# Patient Record
Sex: Female | Born: 1965 | Race: White | Hispanic: No | Marital: Married | Smoking: Never smoker
Health system: Southern US, Community
[De-identification: ages and names within clinical notes are randomized; demographics above are authoritative.]

## PROBLEM LIST (undated history)

## (undated) HISTORY — PX: FOOT SURGERY: SHX648

---

## 2007-01-19 ENCOUNTER — Emergency Department: Payer: Self-pay | Admitting: Emergency Medicine

## 2008-11-20 IMAGING — US ABDOMEN ULTRASOUND
1 series · 17 of 25 positions shown · non-contrast
Comparison: none

REASON FOR EXAM: RUQ pain
COMMENTS:

PROCEDURE:     US  - US ABDOMEN GENERAL SURVEY  - January 19, 2007  [DATE]
RESULT:     Comparison: No available comparison exam.

[Series 1: abdomen ultrasound · 17 of 55 slices shown]
[im 1/55]
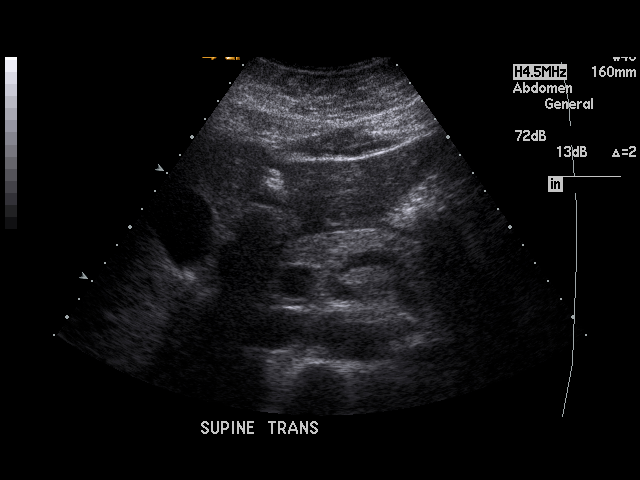
[im 5/55]
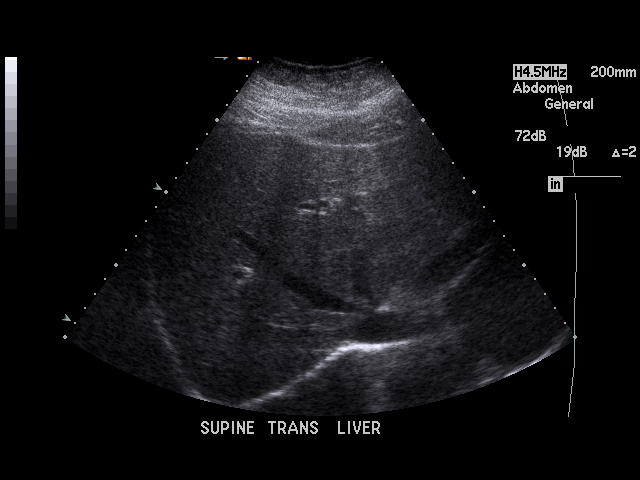
[im 7/55]
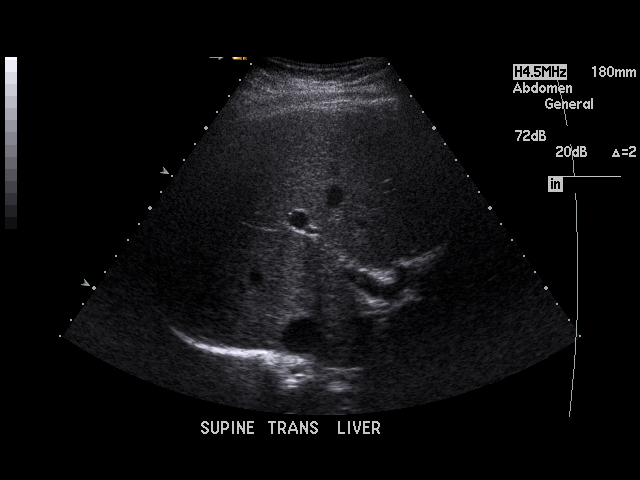
[im 12/55]
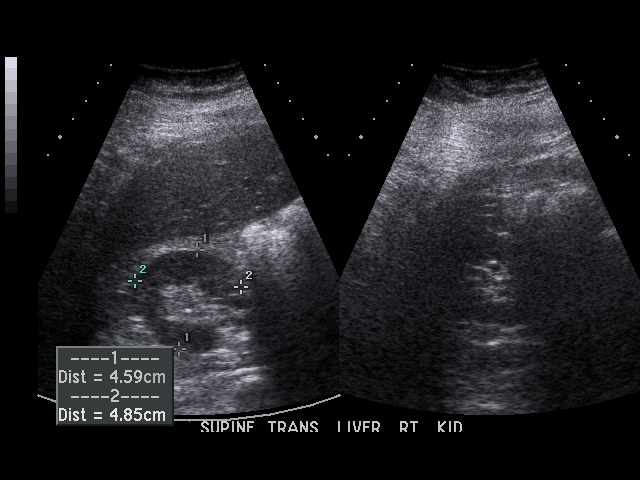
[im 14/55]
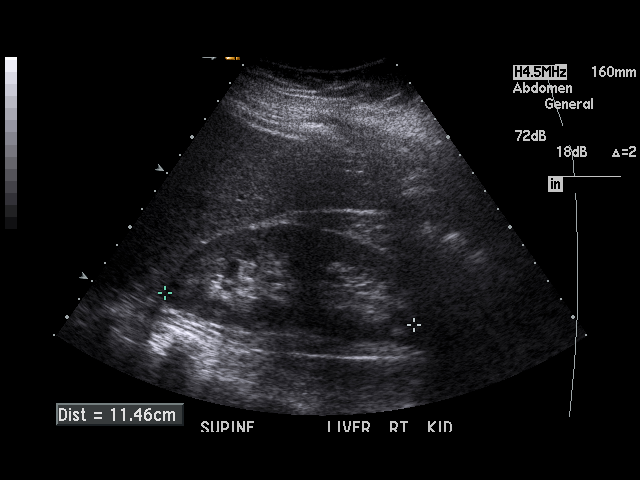
[im 19/55]
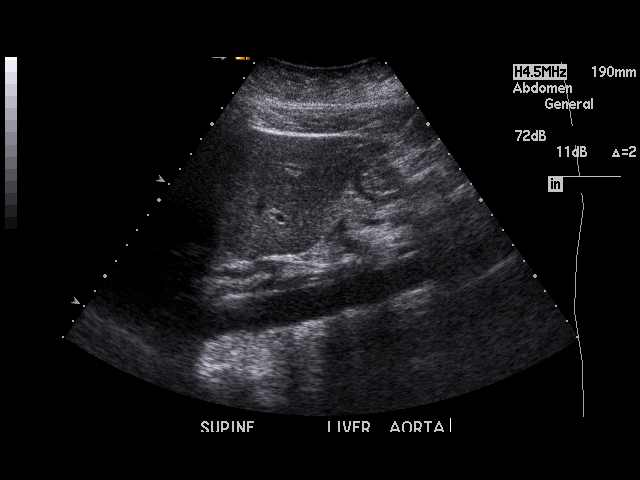
[im 21/55]
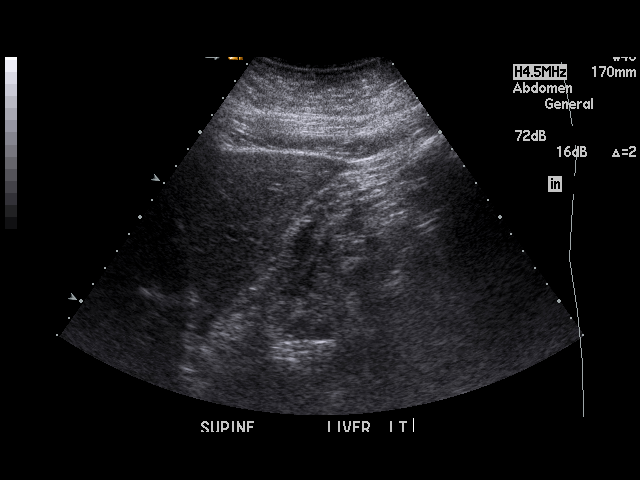
[im 25/55]
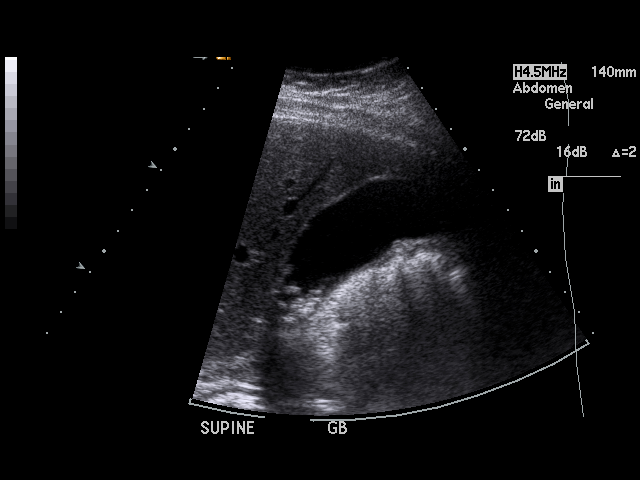
[im 28/55]
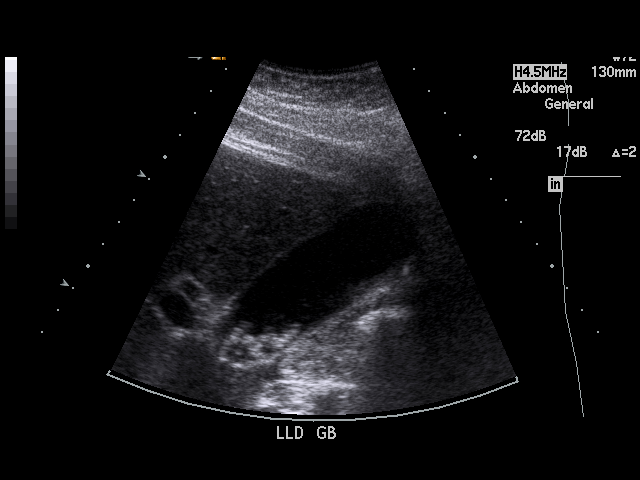
[im 30/55]
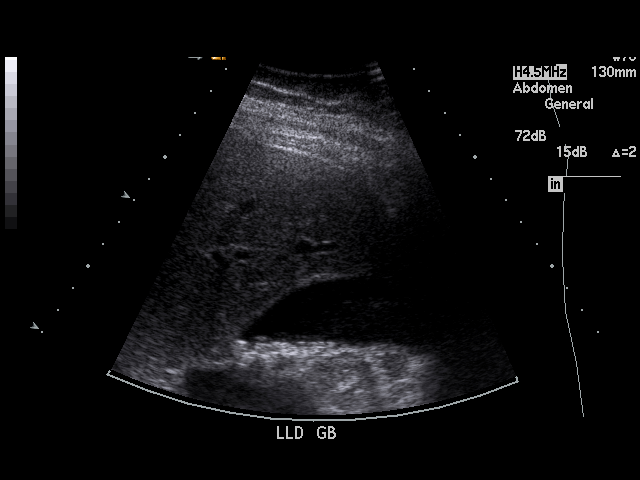
[im 34/55]
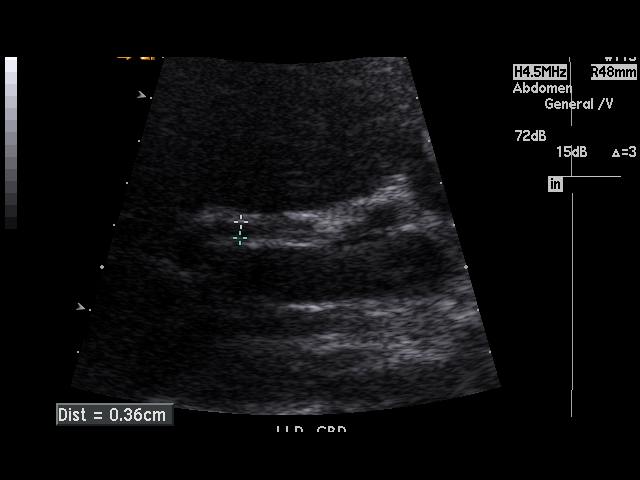
[im 37/55]
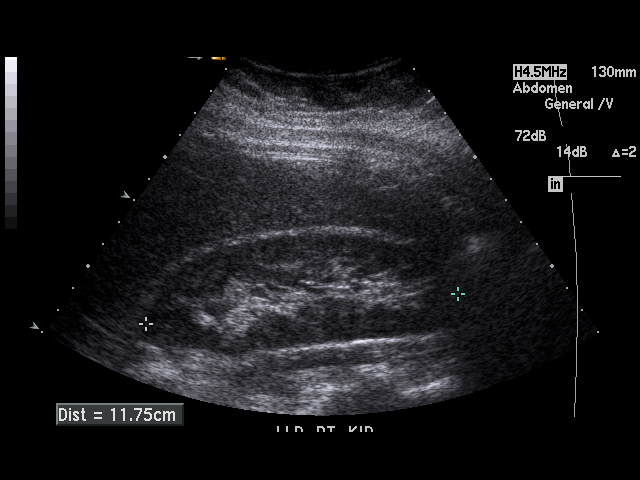
[im 41/55]
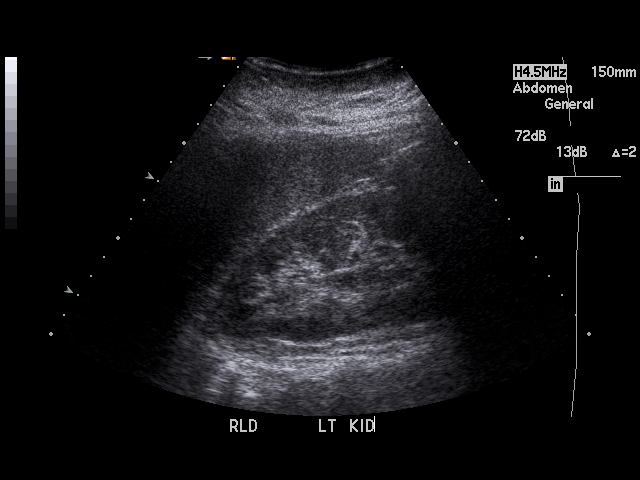
[im 43/55]
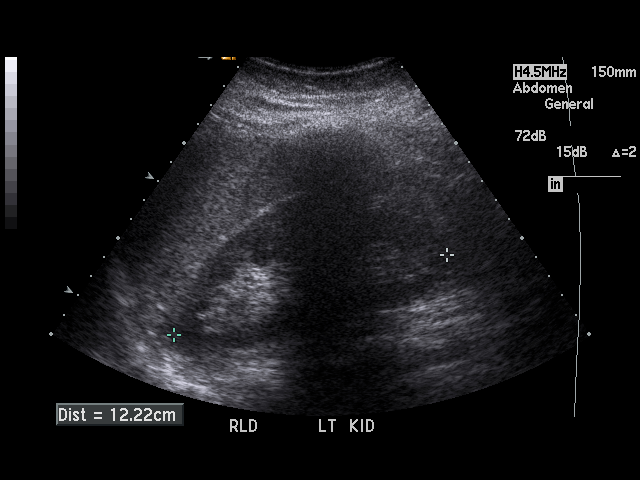
[im 48/55]
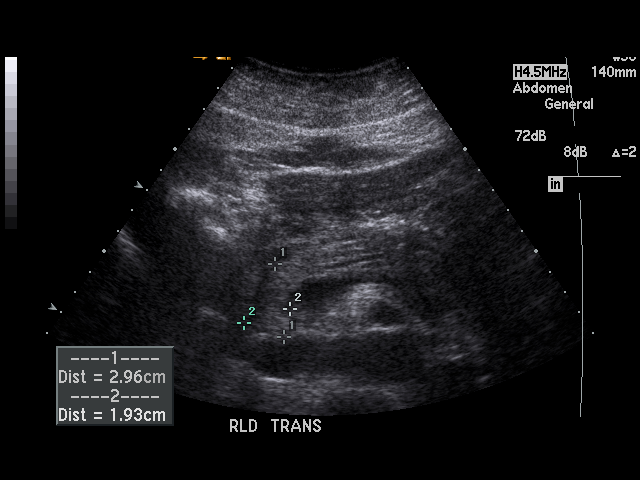
[im 50/55]
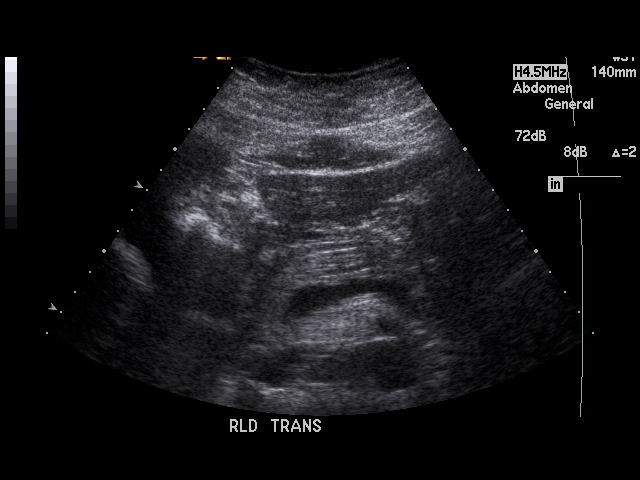
[im 55/55]
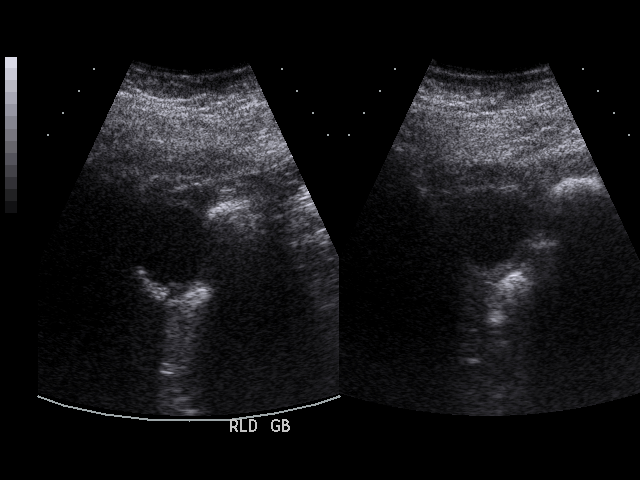

[17 of 25 positions shown; findings below may reference images not displayed]

FINDINGS: The liver is unremarkable without evidence of a focal mass. There is no
intra or extrahepatic bile duct dilatation. The common duct measures 4 mm in
diameter. Multiple small gallstones are seen. There is no significant
gallbladder wall thickening or pericholecystic fluid. There is positive
sonographic Murphy's sign. The main portal vein is patent with hepatopedal
flow.

The pancreas is unremarkable. The right kidney measures 12.2 cm and the left
measures 12.6 cm in length. There is normal corticomedullary
differentiation. No definite renal stone or mass is noted. There is no
hydronephrosis. The spleen measures 11.9 cm in length. No evidence of
abdominal aortic aneurysm.
IMPRESSION: 1. Cholelithiasis with positive sonographic Murphy sign can represent acute
cholecystitis.

Preliminary report was called to Dr. Rousevelt at [DATE] on 01/19/07 by
ultrasound technologist.

## 2013-02-17 ENCOUNTER — Other Ambulatory Visit: Payer: Self-pay | Admitting: *Deleted

## 2013-02-18 ENCOUNTER — Encounter: Payer: Self-pay | Admitting: Podiatry

## 2013-02-18 DIAGNOSIS — M2010 Hallux valgus (acquired), unspecified foot: Secondary | ICD-10-CM

## 2013-02-24 ENCOUNTER — Encounter: Payer: Self-pay | Admitting: *Deleted

## 2013-02-25 ENCOUNTER — Ambulatory Visit (INDEPENDENT_AMBULATORY_CARE_PROVIDER_SITE_OTHER): Payer: 59 | Admitting: Podiatry

## 2013-02-25 ENCOUNTER — Encounter: Payer: Self-pay | Admitting: Podiatry

## 2013-02-25 ENCOUNTER — Ambulatory Visit (INDEPENDENT_AMBULATORY_CARE_PROVIDER_SITE_OTHER): Payer: 59

## 2013-02-25 VITALS — BP 149/99 | HR 109 | Resp 12 | Ht 68.0 in | Wt 250.0 lb

## 2013-02-25 DIAGNOSIS — Z9889 Other specified postprocedural states: Secondary | ICD-10-CM

## 2013-02-25 DIAGNOSIS — R609 Edema, unspecified: Secondary | ICD-10-CM

## 2013-02-25 DIAGNOSIS — M2010 Hallux valgus (acquired), unspecified foot: Secondary | ICD-10-CM

## 2013-02-25 NOTE — Patient Instructions (Signed)
Wear boot or shoe at all times when walking for next 3 weeks

## 2013-02-25 NOTE — Progress Notes (Signed)
Subjective:     Patient ID: Robin Porter, female   DOB: 1965/11/27, 47 y.o.   MRN: [id number]  HPI patient states that she is doing very well with her surgery and having minimal discomfort. Presents wearing her boot 1 week after foot surgery right   Review of Systems  All other systems reviewed and are negative.       Objective:   Physical Exam  Nursing note and vitals reviewed. Cardiovascular: Intact distal pulses.    negative Homans sign was noted and neurological sensation intact. Upon removal of dressing incision site is healing well with good alignment and wound edges well coapted with normal edema noted    Assessment:     Doing well 1 week postop [place] bunionectomy right    Plan:     X-ray reviewed with patient sterile dressing reapplied and Darco shoe dispensed reappoint 2 weeks

## 2013-03-18 ENCOUNTER — Ambulatory Visit (INDEPENDENT_AMBULATORY_CARE_PROVIDER_SITE_OTHER): Payer: 59

## 2013-03-18 ENCOUNTER — Ambulatory Visit (INDEPENDENT_AMBULATORY_CARE_PROVIDER_SITE_OTHER): Payer: 59 | Admitting: Podiatry

## 2013-03-18 ENCOUNTER — Encounter: Payer: Self-pay | Admitting: Podiatry

## 2013-03-18 VITALS — BP 136/90 | HR 91 | Resp 16 | Ht 68.0 in | Wt 250.0 lb

## 2013-03-18 DIAGNOSIS — M2010 Hallux valgus (acquired), unspecified foot: Secondary | ICD-10-CM

## 2013-03-18 DIAGNOSIS — Z9889 Other specified postprocedural states: Secondary | ICD-10-CM

## 2013-03-18 NOTE — Progress Notes (Signed)
Subjective:     Patient ID: Robin Porter, female   DOB: 08/02/1965, 47 y.o.   MRN: [id number]  HPI patient states my foot is doing fine but I over did it one day and my foot was swollen and I got a small opening in my incision site. It's not been draining or red but I wanted you to check 4 weeks after [facility] surgery right foot   Review of Systems     Objective:   Physical Exam  Nursing note and vitals reviewed. Constitutional: She is oriented to person, place, and time.  Cardiovascular: Intact distal pulses.   Musculoskeletal: Normal range of motion.  Neurological: She is oriented to person, place, and time.  Skin: Skin is warm.   patient's right foot the incision site is healing with a small gap in the proximal one third that is localized with no erythema edema or drainage noted. The range of motion of the joint is approximately 20/25 degrees of dorsiflexion 20 of plantar flexion with no pain no crepitus    Assessment:     No indications of infection right incision site and mildly diminished range of motion over were I would like it to be first MPJ    Plan:     Reviewed x-rays and discussed the importance of moving the big toe joint applied a small amount of Iodosorb to the open area and instructed on Band-Aid therapy and Neosporin. If any issues should occur she is to let us know immediately if not will be seen back in 4 weeks right. She admits she has not been walking on the side of foot like she should and she promises to start doing at this time.

## 2013-04-15 ENCOUNTER — Encounter: Payer: 59 | Admitting: Podiatry

## 2013-05-12 NOTE — Progress Notes (Signed)
1. [place] BUNIONECTOMY WITH PIN FIXATION

## 2017-07-07 ENCOUNTER — Encounter: Payer: Self-pay | Admitting: Emergency Medicine

## 2017-07-07 ENCOUNTER — Emergency Department
Admission: EM | Admit: 2017-07-07 | Discharge: 2017-07-07 | Disposition: A | Discharge status: Home / Self Care | Payer: BC Managed Care – PPO | Attending: Emergency Medicine | Admitting: Emergency Medicine

## 2017-07-07 ENCOUNTER — Emergency Department: Payer: BC Managed Care – PPO

## 2017-07-07 ENCOUNTER — Other Ambulatory Visit: Payer: Self-pay

## 2017-07-07 DIAGNOSIS — T148XXA Other injury of unspecified body region, initial encounter: Secondary | ICD-10-CM

## 2017-07-07 DIAGNOSIS — Z79899 Other long term (current) drug therapy: Secondary | ICD-10-CM | POA: Diagnosis not present

## 2017-07-07 DIAGNOSIS — S8012XA Contusion of left lower leg, initial encounter: Secondary | ICD-10-CM | POA: Diagnosis not present

## 2017-07-07 DIAGNOSIS — Y939 Activity, unspecified: Secondary | ICD-10-CM | POA: Insufficient documentation

## 2017-07-07 DIAGNOSIS — Y929 Unspecified place or not applicable: Secondary | ICD-10-CM | POA: Diagnosis not present

## 2017-07-07 DIAGNOSIS — M25562 Pain in left knee: Secondary | ICD-10-CM | POA: Diagnosis present

## 2017-07-07 DIAGNOSIS — Y999 Unspecified external cause status: Secondary | ICD-10-CM | POA: Insufficient documentation

## 2017-07-07 MED ORDER — TRAMADOL HCL 50 MG PO TABS: 50.0000 mg | ORAL_TABLET | Freq: Four times a day (QID) | ORAL | 0 refills | Status: AC | PRN
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
## 2017-07-07 NOTE — Discharge Instructions (Signed)
Follow-up with your regular doctor if you are not better in 5-7 days.  Keep the leg elevated and iced.  Take the tramadol for pain as needed.  He can also take Tylenol and ibuprofen.  Return to the emergency department if you are having more pain in the lower leg.  Especially if you have numbness and tingling in the foot and feel like her foot is going cold
